# Patient Record
Sex: Male | Born: 1970 | Race: White | Hispanic: No | Marital: Married | Smoking: Never smoker
Health system: Southern US, Community
[De-identification: ages and names within clinical notes are randomized; demographics above are authoritative.]

## PROBLEM LIST (undated history)

## (undated) DIAGNOSIS — C801 Malignant (primary) neoplasm, unspecified: Secondary | ICD-10-CM

## (undated) DIAGNOSIS — N200 Calculus of kidney: Secondary | ICD-10-CM

## (undated) DIAGNOSIS — G2581 Restless legs syndrome: Secondary | ICD-10-CM

## (undated) HISTORY — PX: OTHER SURGICAL HISTORY: SHX169

## (undated) HISTORY — PX: BRAIN SURGERY: SHX531

## (undated) HISTORY — PX: BACK SURGERY: SHX140

---

## 2013-11-09 ENCOUNTER — Encounter (HOSPITAL_COMMUNITY): Payer: Self-pay | Admitting: Emergency Medicine

## 2013-11-09 ENCOUNTER — Emergency Department (HOSPITAL_COMMUNITY)
Admission: EM | Admit: 2013-11-09 | Discharge: 2013-11-09 | Disposition: A | Discharge status: Home / Self Care | Payer: BC Managed Care – PPO | Attending: Emergency Medicine | Admitting: Emergency Medicine

## 2013-11-09 ENCOUNTER — Emergency Department (HOSPITAL_COMMUNITY): Payer: BC Managed Care – PPO

## 2013-11-09 DIAGNOSIS — Z79899 Other long term (current) drug therapy: Secondary | ICD-10-CM | POA: Insufficient documentation

## 2013-11-09 DIAGNOSIS — G2581 Restless legs syndrome: Secondary | ICD-10-CM | POA: Diagnosis not present

## 2013-11-09 DIAGNOSIS — Z859 Personal history of malignant neoplasm, unspecified: Secondary | ICD-10-CM | POA: Diagnosis not present

## 2013-11-09 DIAGNOSIS — Z87442 Personal history of urinary calculi: Secondary | ICD-10-CM | POA: Insufficient documentation

## 2013-11-09 DIAGNOSIS — R109 Unspecified abdominal pain: Secondary | ICD-10-CM | POA: Insufficient documentation

## 2013-11-09 HISTORY — DX: Calculus of kidney: N20.0

## 2013-11-09 HISTORY — DX: Restless legs syndrome: G25.81

## 2013-11-09 HISTORY — DX: Malignant (primary) neoplasm, unspecified: C80.1

## 2013-11-09 LAB — URINALYSIS, ROUTINE W REFLEX MICROSCOPIC
Bilirubin Urine: NEGATIVE
Glucose, UA: NEGATIVE mg/dL
Ketones, ur: NEGATIVE mg/dL
Leukocytes, UA: NEGATIVE
Nitrite: NEGATIVE
Protein, ur: NEGATIVE mg/dL
Specific Gravity, Urine: 1.025 (ref 1.005–1.030)
Urobilinogen, UA: 0.2 mg/dL (ref 0.0–1.0)
pH: 5.5 (ref 5.0–8.0)

## 2013-11-09 LAB — BASIC METABOLIC PANEL
Anion gap: 12 (ref 5–15)
BUN: 14 mg/dL (ref 6–23)
CO2: 28 meq/L (ref 19–32)
Calcium: 9.2 mg/dL (ref 8.4–10.5)
Chloride: 101 mEq/L (ref 96–112)
Creatinine, Ser: 1.49 mg/dL — ABNORMAL HIGH (ref 0.50–1.35)
GFR calc Af Amer: 64 mL/min — ABNORMAL LOW (ref >90)
GFR calc non Af Amer: 55 mL/min — ABNORMAL LOW (ref >90)
Glucose, Bld: 85 mg/dL (ref 70–99)
Potassium: 3.9 mEq/L (ref 3.7–5.3)
SODIUM: 141 meq/L (ref 137–147)

## 2013-11-09 LAB — CBC WITH DIFFERENTIAL/PLATELET
Basophils Absolute: 0 10*3/uL (ref 0.0–0.1)
Basophils Relative: 0 % (ref 0–1)
Eosinophils Absolute: 0.1 10*3/uL (ref 0.0–0.7)
Eosinophils Relative: 1 % (ref 0–5)
HCT: 45.3 % (ref 39.0–52.0)
Hemoglobin: 14.9 g/dL (ref 13.0–17.0)
LYMPHS ABS: 1.9 10*3/uL (ref 0.7–4.0)
LYMPHS PCT: 22 % (ref 12–46)
MCH: 28.7 pg (ref 26.0–34.0)
MCHC: 32.9 g/dL (ref 30.0–36.0)
MCV: 87.1 fL (ref 78.0–100.0)
Monocytes Absolute: 0.6 10*3/uL (ref 0.1–1.0)
Monocytes Relative: 7 % (ref 3–12)
Neutro Abs: 6 10*3/uL (ref 1.7–7.7)
Neutrophils Relative %: 70 % (ref 43–77)
PLATELETS: 241 10*3/uL (ref 150–400)
RBC: 5.2 MIL/uL (ref 4.22–5.81)
RDW: 14.3 % (ref 11.5–15.5)
WBC: 8.6 10*3/uL (ref 4.0–10.5)

## 2013-11-09 LAB — URINE MICROSCOPIC-ADD ON

## 2013-11-09 MED ORDER — HYDROMORPHONE HCL PF 1 MG/ML IJ SOLN
1.0000 mg | Freq: Once | INTRAMUSCULAR | Status: AC
  Administered 2013-11-09: 1 mg via INTRAVENOUS
  Filled 2013-11-09: qty 1

## 2013-11-09 MED ORDER — ONDANSETRON HCL 4 MG/2ML IJ SOLN
4.0000 mg | Freq: Once | INTRAMUSCULAR | Status: AC
  Administered 2013-11-09: 4 mg via INTRAVENOUS
  Filled 2013-11-09: qty 2

## 2013-11-09 MED ORDER — SODIUM CHLORIDE 0.9 % IV BOLUS (SEPSIS)
1000.0000 mL | Freq: Once | INTRAVENOUS | Status: AC
Start: 2013-11-09 — End: 2013-11-09
  Administered 2013-11-09: 1000 mL via INTRAVENOUS

## 2013-11-09 MED ORDER — SODIUM CHLORIDE 0.9 % IV BOLUS (SEPSIS)
1000.0000 mL | Freq: Once | INTRAVENOUS | Status: AC
  Administered 2013-11-09: 1000 mL via INTRAVENOUS

## 2013-11-09 MED ORDER — PROMETHAZINE HCL 25 MG PO TABS: 25.0000 mg | ORAL_TABLET | Freq: Four times a day (QID) | ORAL | Status: AC | PRN

## 2013-11-09 MED ORDER — OXYCODONE-ACETAMINOPHEN 5-325 MG PO TABS: 2.0000 | ORAL_TABLET | ORAL | Status: AC | PRN

## 2013-11-09 NOTE — ED Notes (Signed)
Pt alert & oriented x4, stable gait. Patient given discharge instructions, paperwork & prescription(s). Patient  instructed to stop at the registration desk to finish any additional paperwork. Patient verbalized understanding. Pt left department w/ no further questions. 

## 2013-11-09 NOTE — ED Notes (Signed)
Dr Cook at bedside,  

## 2013-11-09 NOTE — Discharge Instructions (Signed)
Tests showed stones in your kidneys, but not in the ureter. Medication for pain and nausea. Increase fluids. Do not get dehydrated.

## 2013-11-09 NOTE — ED Notes (Signed)
Pt made aware that urine sample is needed.

## 2013-11-09 NOTE — ED Provider Notes (Signed)
CSN: [id number]     Arrival date & time 11/09/13  1152 History  This chart was scribed for Nat Christen, MD by Ludger Nutting, ED Scribe. This patient was seen in room [facility]02/[facility]02 and the patient's care was started 1:51 PM.    Chief Complaint  Patient presents with  . Flank Pain    The history is provided by the patient. No language interpreter was used.   HPI Comments: Colin Ball is a 43 y.o. male who presents to the Emergency Department complaining of constant right flank pain that began this morning. He reports associated orange-colored urine, nausea, and 2 episodes of vomiting. He reports history of multiple kidney stones. 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 Patient believes that he may be dehydrated due to this. 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   Past Medical History  Diagnosis Date  . Kidney stones   . Restless leg syndrome   . Cancer    Past Surgical History  Procedure Laterality Date  . Surgery for melanom    . Brain surgery    . Brain tumor surgery    . Back surgery     History reviewed. No pertinent family history. History  Substance Use Topics  . Smoking status: Never Smoker   . Smokeless tobacco: Not on file  . Alcohol Use: Yes    Review of Systems  A complete 10 system review of systems was obtained and all systems are negative except as noted in the HPI and PMH.    Allergies  Review of patient's allergies indicates no known allergies.  Home Medications   Prior to Admission medications   Medication Sig Start Date End Date Taking? Authorizing Provider  citalopram (CELEXA) 40 MG tablet Take 1 tablet by mouth daily. 08/12/13  Yes Historical Provider, MD  rOPINIRole (REQUIP) 2 MG tablet Take 1 tablet by mouth at bedtime. 11/08/13  Yes Historical Provider, MD  testosterone cypionate (DEPOTESTOTERONE CYPIONATE) 200 MG/ML injection Inject 200 mg into  the muscle once a week.  09/18/13  Yes Historical Provider, MD  oxyCODONE-acetaminophen (PERCOCET) 5-325 MG per tablet Take 2 tablets by mouth every 4 (four) hours as needed. 11/09/13   Nat Christen, MD  promethazine (PHENERGAN) 25 MG tablet Take 1 tablet (25 mg total) by mouth every 6 (six) hours as needed. 11/09/13   Nat Christen, MD   BP 137/88  Pulse 51  Temp(Src) 98.1 F (36.7 C) (Oral)  Resp 14  Ht 5\' 8"  (1.727 m)  Wt 180 lb (81.647 kg)  BMI 27.38 kg/m2  SpO2 99% Physical Exam  Nursing note and vitals reviewed. Constitutional: He is oriented to person, place, and time. He appears well-developed and well-nourished.  HENT:  Head: Normocephalic and atraumatic.  Eyes: Conjunctivae and EOM are normal. Pupils are equal, round, and reactive to light.  Neck: Normal range of motion. Neck supple.  Cardiovascular: Normal rate, regular rhythm and normal heart sounds.   Pulmonary/Chest: Effort normal and breath sounds normal.  Abdominal: Soft. Bowel sounds are normal.  Genitourinary:  Right flank tenderness  Musculoskeletal: Normal range of motion.  Neurological: He is alert and oriented to person, place, and time.  Skin: Skin is warm and dry.  Psychiatric: He has a normal mood and affect. His behavior is normal.    ED Course  Procedures (including critical care time)  DIAGNOSTIC STUDIES: Oxygen Saturation  is 98% on RA, normal by my interpretation.    COORDINATION OF CARE: 1:56 PM Will order abdominal CT and lab work. Will give IV fluids, Zofran, and pain medication. Discussed treatment plan with pt at bedside and pt agreed to plan.   Labs Review Labs Reviewed  BASIC METABOLIC PANEL - Abnormal; Notable for the following:    Creatinine, Ser 1.49 (*)    GFR calc non Af Amer 55 (*)    GFR calc Af Amer 64 (*)    All other components within normal limits  URINALYSIS, ROUTINE W REFLEX MICROSCOPIC - Abnormal; Notable for the following:    Hgb urine dipstick TRACE (*)    All other  components within normal limits  CBC WITH DIFFERENTIAL  URINE MICROSCOPIC-ADD ON    Imaging Review Ct Abdomen Pelvis Wo Contrast  11/09/2013   CLINICAL DATA:  Right flank pain.  History of kidney stones.  EXAM: CT ABDOMEN AND PELVIS WITHOUT CONTRAST  TECHNIQUE: Multidetector CT imaging of the abdomen and pelvis was performed following the standard protocol without IV contrast.  COMPARISON:  None.  FINDINGS: The visualized lung bases are clear. Mild cardiomegaly is partially visualized.  Limited noncontrast evaluation of the liver is unremarkable.  Gallbladder within normal limits. No biliary dilatation. The spleen, adrenal glands, and pancreas are unremarkable.  Scattered punctate hyperdensities within the left kidney likely reflect small nonobstructive stones. No CT evidence of obstructive uropathy.  On the right, a small nonobstructive 3 mm calculus present within the upper pole. Additional faint hyperdensities within the right kidney may represent additional punctate nonobstructive calculi. No stones seen along the course of the right renal collecting system. There is no right-sided hydronephrosis or hydroureter.  Stomach within normal limits. No evidence of bowel obstruction. Appendix well visualized in the right lower quadrant and is of normal caliber and appearance without associated inflammatory changes to suggest acute appendicitis. No acute inflammatory changes seen elsewhere about the bowels. Sequelae of prior ventral hernia repair seen within the upper abdomen.  Bladder within normal limits.  Prostate is normal.  No free air or fluid. No pathologically enlarged intra-abdominal pelvic lymph nodes.  Patient is status post posterior spinal decompression with fusion at the L4 thru S1 levels. No acute osseus abnormality. No worrisome lytic or blastic osseous lesions.  IMPRESSION: 1. Bilateral nonobstructive nephrolithiasis measuring up to 3 mm within the right kidney. No CT evidence of obstructive  uropathy. 2. No other acute intra-abdominal or pelvic abnormality identified. Normal appendix. 3. Cardiomegaly.   Electronically Signed   By: Jeannine Boga M.D.   On: 11/09/2013 15:09     EKG Interpretation None      MDM   Final diagnoses:  Right flank pain    Patient feels much better after IV hydration and pain management.  CT scan of abdomen pelvis show bilateral nephrolithiasis but no ureteral stones. He is hemodynamically stable. Discharge medications Percocet and Phenergan 25 mg I personally performed the services described in this documentation, which was scribed in my presence. The recorded information has been reviewed and is accurate.   Nat Christen, MD 11/09/13 2154

## 2013-11-09 NOTE — ED Notes (Signed)
Has been working in heat for last days,  Feels weak, Nausea , vomited x2.   Feels he is dehydrated.

## 2015-01-09 IMAGING — CT CT ABD-PELV W/O CM
3 of 4 series · 9 of 46 positions shown, 16 images · non-contrast
Comparison: None.

CLINICAL DATA: Right flank pain.  History of kidney stones.

EXAM:
CT ABDOMEN AND PELVIS WITHOUT CONTRAST
TECHNIQUE: Multidetector CT imaging of the abdomen and pelvis was performed
following the standard protocol without IV contrast.

[Series 3: lung 5.0 b60f · axial · 0.66mm/px · z∈[-160,-80]mm · 5 of 26 slices shown, 10 images]
[im 5/26  soft-tissue]
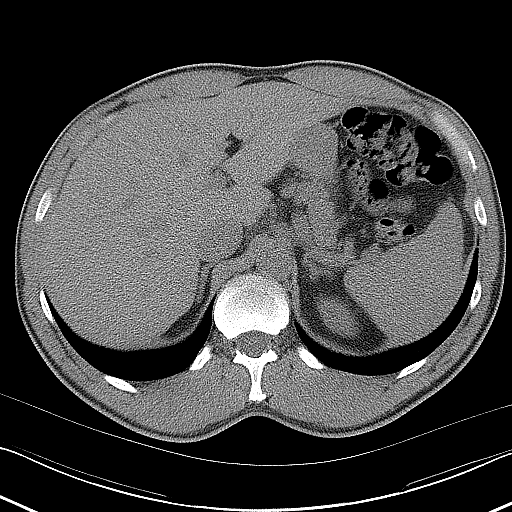
[im 5/26  bone]
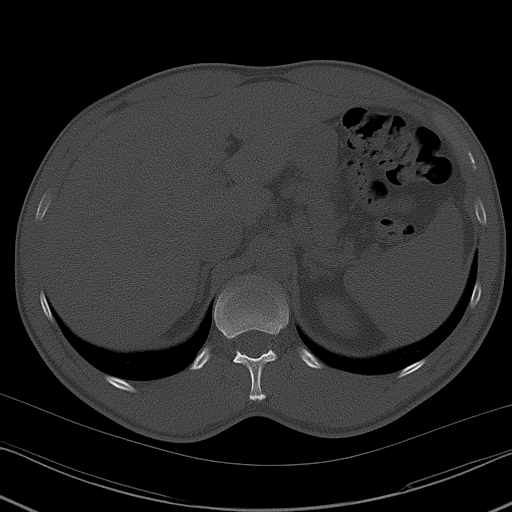
[im 9/26  soft-tissue]
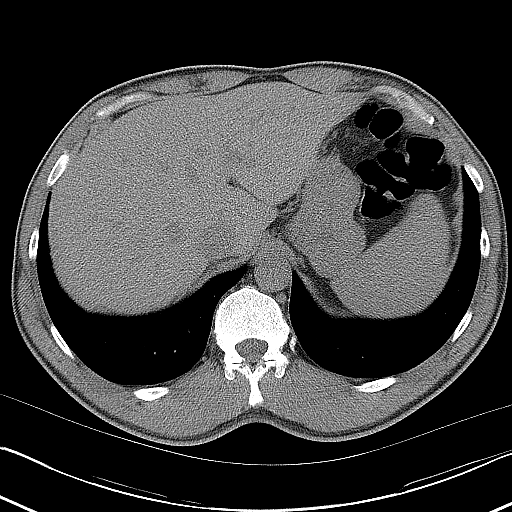
[im 9/26  lung]
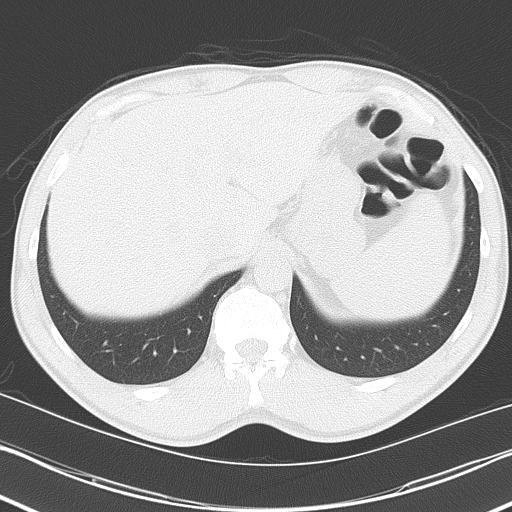
[im 13/26  soft-tissue]
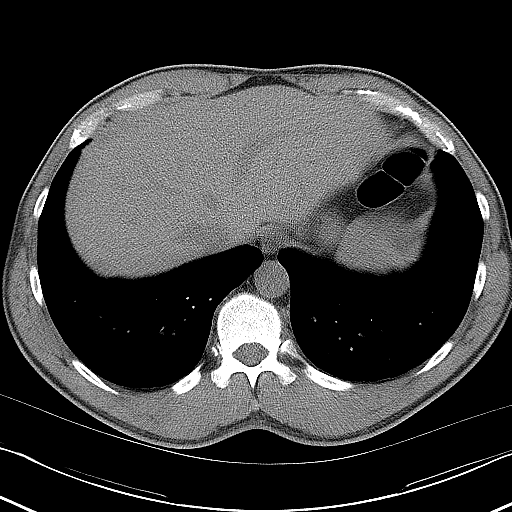
[im 13/26  lung]
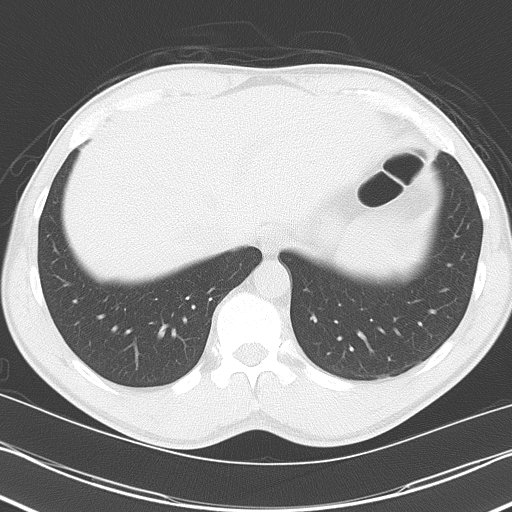
[im 17/26  soft-tissue]
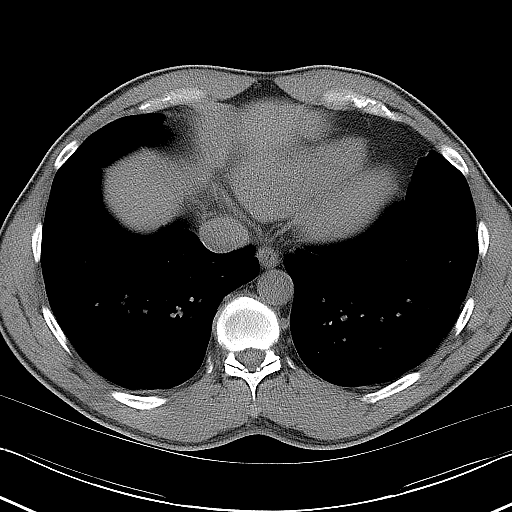
[im 17/26  lung]
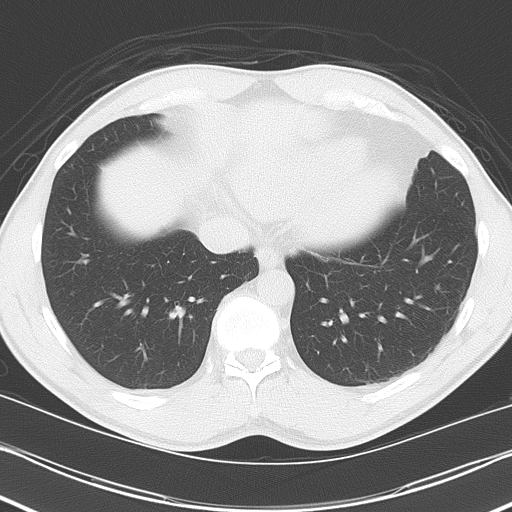
[im 21/26  soft-tissue]
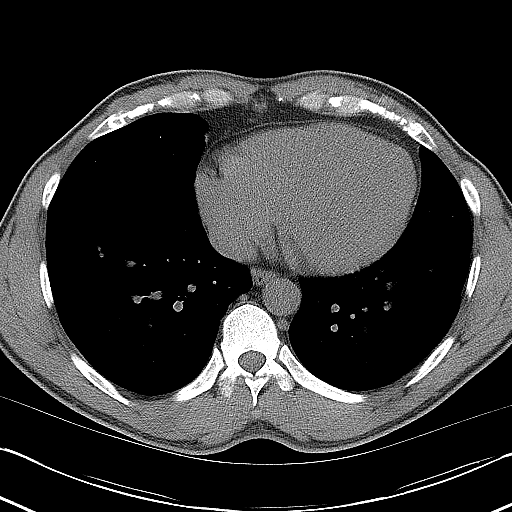
[im 21/26  lung]
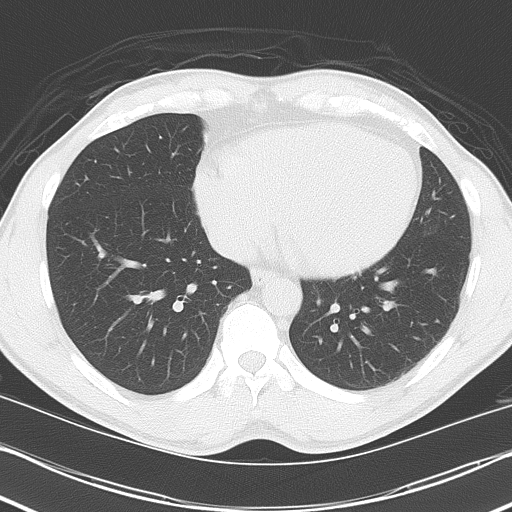

[Series 4: mpr coronal (id) · coronal · 0.72mm/px · 3 of 102 slices shown, 4 images]
[im 34/102  soft-tissue]
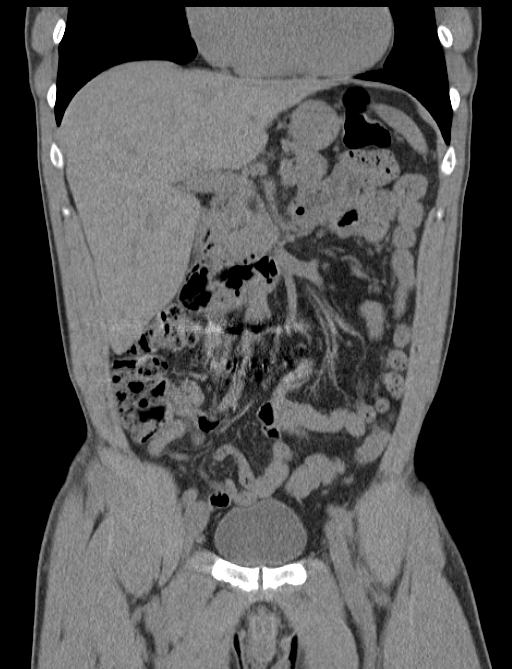
[im 45/102  soft-tissue]
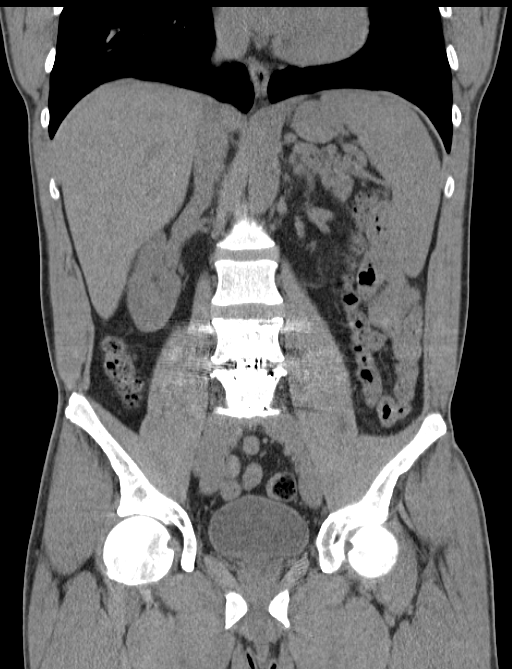
[im 45/102  bone]
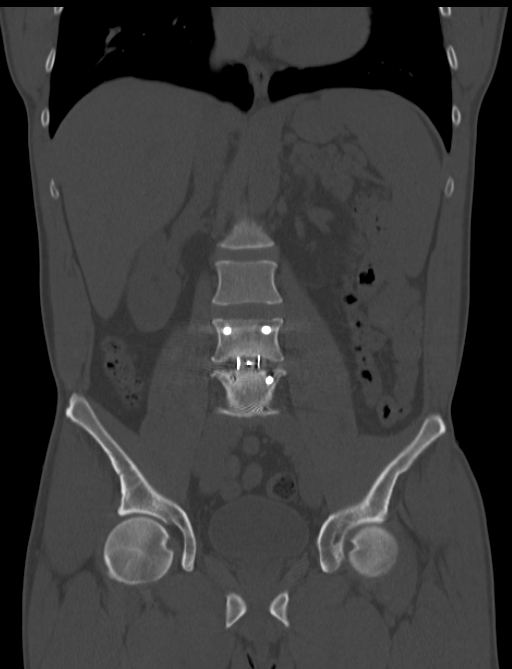
[im 57/102  soft-tissue]
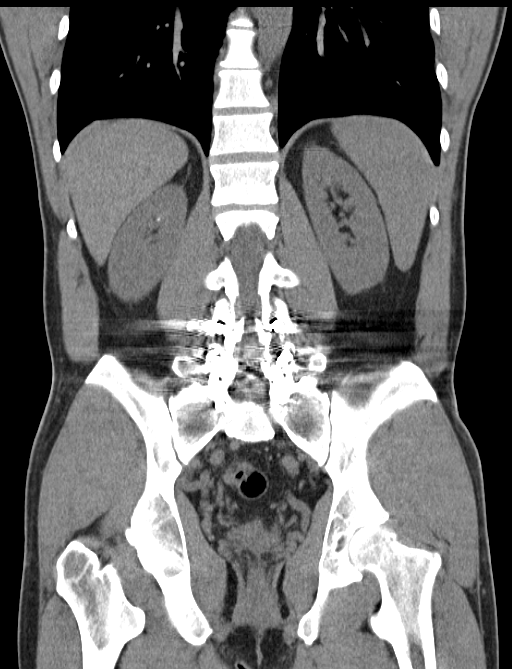

[Series 5: mpr sagittal (id) · sagittal · 0.57mm/px · 1 of 107 slices shown, 2 images]
[im 36/107  soft-tissue]
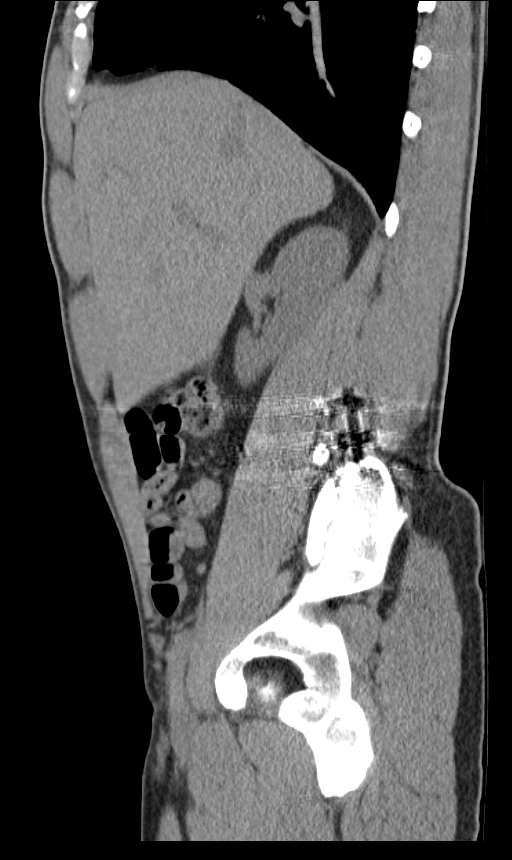
[im 36/107  bone]
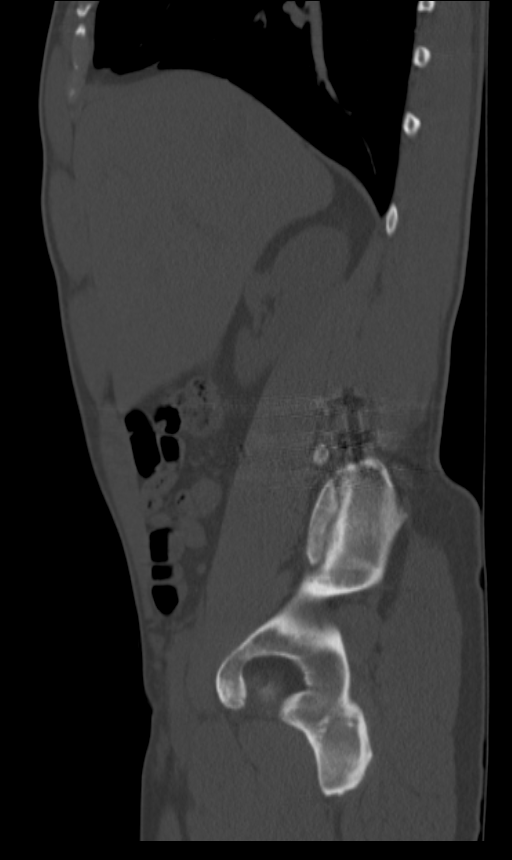

[9 of 46 positions shown; findings below may reference images not displayed]

FINDINGS: The visualized lung bases are clear. Mild cardiomegaly is partially
visualized.

Limited noncontrast evaluation of the liver is unremarkable.

Gallbladder within normal limits. No biliary dilatation. The spleen,
adrenal glands, and pancreas are unremarkable.

Scattered punctate hyperdensities within the left kidney likely
reflect small nonobstructive stones. No CT evidence of obstructive
uropathy.

On the right, a small nonobstructive 3 mm calculus present within
the upper pole. Additional faint hyperdensities within the right
kidney may represent additional punctate nonobstructive calculi. No
stones seen along the course of the right renal collecting system.
There is no right-sided hydronephrosis or hydroureter.

Stomach within normal limits. No evidence of bowel obstruction.
Appendix well visualized in the right lower quadrant and is of
normal caliber and appearance without associated inflammatory
changes to suggest acute appendicitis. No acute inflammatory changes
seen elsewhere about the bowels. Sequelae of prior ventral hernia
repair seen within the upper abdomen.

Bladder within normal limits.  Prostate is normal.

No free air or fluid. No pathologically enlarged intra-abdominal
pelvic lymph nodes.

Patient is status post posterior spinal decompression with fusion at
the L4 thru S1 levels. No acute osseus abnormality. No worrisome
lytic or blastic osseous lesions.
IMPRESSION: 1. Bilateral nonobstructive nephrolithiasis measuring up to 3 mm
within the right kidney. No CT evidence of obstructive uropathy.
2. No other acute intra-abdominal or pelvic abnormality identified.
Normal appendix.
3. Cardiomegaly.

## 2021-03-01 DEATH — deceased
